# Patient Record
Sex: Male | Born: 1970 | Race: Black or African American | Hispanic: No | Marital: Single | Smoking: Former smoker
Health system: Southern US, Community
[De-identification: ages and names within clinical notes are randomized; demographics above are authoritative.]

## PROBLEM LIST (undated history)

## (undated) DIAGNOSIS — I10 Essential (primary) hypertension: Secondary | ICD-10-CM

---

## 2016-04-22 ENCOUNTER — Encounter (HOSPITAL_COMMUNITY): Payer: Self-pay

## 2016-04-22 ENCOUNTER — Emergency Department (HOSPITAL_COMMUNITY): Payer: Self-pay

## 2016-04-22 ENCOUNTER — Emergency Department (HOSPITAL_COMMUNITY)
Admission: EM | Admit: 2016-04-22 | Discharge: 2016-04-22 | Disposition: A | Discharge status: Home / Self Care | Payer: Self-pay | Attending: Emergency Medicine | Admitting: Emergency Medicine

## 2016-04-22 DIAGNOSIS — R0602 Shortness of breath: Secondary | ICD-10-CM | POA: Insufficient documentation

## 2016-04-22 DIAGNOSIS — R51 Headache: Secondary | ICD-10-CM

## 2016-04-22 DIAGNOSIS — I159 Secondary hypertension, unspecified: Secondary | ICD-10-CM | POA: Insufficient documentation

## 2016-04-22 DIAGNOSIS — R519 Headache, unspecified: Secondary | ICD-10-CM

## 2016-04-22 DIAGNOSIS — Z79899 Other long term (current) drug therapy: Secondary | ICD-10-CM | POA: Insufficient documentation

## 2016-04-22 DIAGNOSIS — Z87891 Personal history of nicotine dependence: Secondary | ICD-10-CM | POA: Insufficient documentation

## 2016-04-22 HISTORY — DX: Essential (primary) hypertension: I10

## 2016-04-22 LAB — BASIC METABOLIC PANEL
ANION GAP: 8 (ref 5–15)
BUN: 15 mg/dL (ref 6–20)
CHLORIDE: 100 mmol/L — AB (ref 101–111)
CO2: 27 mmol/L (ref 22–32)
Calcium: 9.5 mg/dL (ref 8.9–10.3)
Creatinine, Ser: 1.13 mg/dL (ref 0.61–1.24)
GFR calc Af Amer: >60 mL/min (ref >60)
GLUCOSE: 84 mg/dL (ref 65–99)
POTASSIUM: 4.6 mmol/L (ref 3.5–5.1)
Sodium: 135 mmol/L (ref 135–145)

## 2016-04-22 LAB — CBC WITH DIFFERENTIAL/PLATELET
BASOS ABS: 0 10*3/uL (ref 0.0–0.1)
BASOS PCT: 1 %
EOS ABS: 0.5 10*3/uL (ref 0.0–0.7)
Eosinophils Relative: 8 %
HCT: 44.4 % (ref 39.0–52.0)
HEMOGLOBIN: 15.3 g/dL (ref 13.0–17.0)
Lymphocytes Relative: 40 %
Lymphs Abs: 2.4 10*3/uL (ref 0.7–4.0)
MCH: 31.7 pg (ref 26.0–34.0)
MCHC: 34.5 g/dL (ref 30.0–36.0)
MCV: 92.1 fL (ref 78.0–100.0)
MONOS PCT: 9 %
Monocytes Absolute: 0.6 10*3/uL (ref 0.1–1.0)
NEUTROS ABS: 2.6 10*3/uL (ref 1.7–7.7)
NEUTROS PCT: 42 %
Platelets: 208 10*3/uL (ref 150–400)
RBC: 4.82 MIL/uL (ref 4.22–5.81)
RDW: 12 % (ref 11.5–15.5)
WBC: 6 10*3/uL (ref 4.0–10.5)

## 2016-04-22 LAB — URINALYSIS, ROUTINE W REFLEX MICROSCOPIC
Bilirubin Urine: NEGATIVE
GLUCOSE, UA: NEGATIVE mg/dL
Hgb urine dipstick: NEGATIVE
KETONES UR: NEGATIVE mg/dL
LEUKOCYTES UA: NEGATIVE
Nitrite: NEGATIVE
PROTEIN: NEGATIVE mg/dL
Specific Gravity, Urine: 1.008 (ref 1.005–1.030)
pH: 5.5 (ref 5.0–8.0)

## 2016-04-22 LAB — I-STAT TROPONIN, ED: TROPONIN I, POC: 0.01 ng/mL (ref 0.00–0.08)

## 2016-04-22 MED ORDER — HYDROCHLOROTHIAZIDE 25 MG PO TABS: 25.0000 mg | ORAL_TABLET | Freq: Every day | ORAL | 0 refills | Status: AC

## 2016-04-22 MED ORDER — HYDROCHLOROTHIAZIDE 25 MG PO TABS: 25.0000 mg | ORAL_TABLET | Freq: Every day | ORAL | 0 refills | Status: DC

## 2016-04-22 MED ORDER — IBUPROFEN 800 MG PO TABS
800.0000 mg | ORAL_TABLET | Freq: Once | ORAL | Status: AC
  Administered 2016-04-22: 800 mg via ORAL
  Filled 2016-04-22: qty 1

## 2016-04-22 MED ORDER — HYDROCHLOROTHIAZIDE 12.5 MG PO CAPS
25.0000 mg | ORAL_CAPSULE | Freq: Once | ORAL | Status: AC
  Administered 2016-04-22: 25 mg via ORAL
  Filled 2016-04-22: qty 2

## 2016-04-22 NOTE — ED Provider Notes (Signed)
[facility]-EMERGENCY DEPT Provider Note   CSN: [id number] Arrival date & time: 04/22/16  1125     History   Chief Complaint Chief Complaint  Patient presents with  . Hypertension  . Headache    HPI Juan Randall is a 45 y.o. male.  The history is provided by the patient.  Hypertension  This is a new problem. The problem occurs constantly. Associated symptoms include headaches and shortness of breath (intermittent). Pertinent negatives include no chest pain and no abdominal pain. Nothing aggravates the symptoms. Nothing relieves the symptoms. He has tried acetaminophen for the symptoms. The treatment provided moderate relief.  Headache   This is a new problem. The current episode started more than 1 week ago. The problem occurs constantly. The problem has been gradually worsening. The headache is associated with nothing. The pain is located in the bilateral and frontal region. The pain is moderate. The pain does not radiate. Associated symptoms include shortness of breath (intermittent). Pertinent negatives include no fever, no chest pressure, no palpitations, no nausea and no vomiting.    Past Medical History:  Diagnosis Date  . Hypertension     There are no active problems to display for this patient.   History reviewed. No pertinent surgical history.     Home Medications    Prior to Admission medications   Medication Sig Start Date End Date Taking? Authorizing Provider  acetaminophen (TYLENOL) 325 MG tablet Take 650 mg by mouth every 6 (six) hours as needed for headache.   Yes Historical Provider, MD  atorvastatin (LIPITOR) 40 MG tablet Take 40 mg by mouth every morning.   Yes Historical Provider, MD  GARLIC PO Take 3 capsules by mouth daily.   Yes Historical Provider, MD  Multiple Vitamin (MULTIVITAMIN WITH MINERALS) TABS tablet Take 1 tablet by mouth daily.   Yes Historical Provider, MD  Omega-3 Fatty Acids (FISH OIL) 1000 MG CAPS Take 3,000 mg by mouth daily.   Yes Historical  Provider, MD  hydrochlorothiazide (HYDRODIURIL) 25 MG tablet Take 1 tablet (25 mg total) by mouth daily. 04/22/16 05/22/16  Nira Conn, MD    Family History History reviewed. No pertinent family history.  Social History Social History  Substance Use Topics  . Smoking status: Former Games developer  . Smokeless tobacco: Never Used  . Alcohol use Yes     Allergies   Review of patient's allergies indicates no known allergies.   Review of Systems Review of Systems  Constitutional: Negative for chills and fever.  HENT: Negative for ear pain and sore throat.   Eyes: Negative for pain and visual disturbance.  Respiratory: Positive for shortness of breath (intermittent). Negative for cough.   Cardiovascular: Negative for chest pain and palpitations.  Gastrointestinal: Negative for abdominal pain, nausea and vomiting.  Genitourinary: Negative for dysuria and hematuria.  Musculoskeletal: Negative for arthralgias and back pain.  Skin: Negative for color change and rash.  Neurological: Positive for headaches. Negative for seizures and syncope.  All other systems reviewed and are negative.    Physical Exam Updated Vital Signs  Vitals:   04/22/16 1429 04/22/16 1435 04/22/16 1440 04/22/16 1601  BP:  (!) 158/108  149/98  Pulse:    71  Resp:   18 18  Temp:      TempSrc:      SpO2: 100%   100%  Weight:      Height:        Physical Exam  Constitutional: He is oriented to person, place, and  time. He appears well-developed and well-nourished. No distress.  HENT:  Head: Normocephalic and atraumatic.  Nose: Nose normal.  Eyes: Conjunctivae and EOM are normal. Pupils are equal, round, and reactive to light. Right eye exhibits no discharge. Left eye exhibits no discharge. No scleral icterus.  Neck: Normal range of motion. Neck supple.  Cardiovascular: Normal rate and regular rhythm.  Exam reveals no gallop and no friction rub.   No murmur heard. Pulmonary/Chest: Effort normal and  breath sounds normal. No stridor. No respiratory distress. He has no rales.  Abdominal: Soft. He exhibits no distension. There is no tenderness.  Musculoskeletal: He exhibits no edema or tenderness.  Neurological: He is alert and oriented to person, place, and time.  Mental Status: Alert and oriented to person, place, and time. Attention and concentration normal. Speech clear. Recent memory is intac  Cranial Nerves  II Visual Fields: Intact to confrontation. Visual fields intact. III, IV, VI: Pupils equal and reactive to light and near. Full eye movement without nystagmus  V Facial Sensation: Normal. No weakness of masticatory muscles  VII: No facial weakness or asymmetry  VIII Auditory Acuity: Grossly normal  IX/X: The uvula is midline; the palate elevates symmetrically  XI: Normal sternocleidomastoid and trapezius strength  XII: The tongue is midline. No atrophy or fasciculations.   Motor System: Muscle Strength: 5/5 and symmetric in the upper and lower extremities. No pronation or drift.  Muscle Tone: Tone and muscle bulk are normal in the upper and lower extremities.   Reflexes: DTRs: 2+ and symmetrical in all four extremities. Plantar responses are flexor bilaterally.  Coordination: Intact finger-to-nose, heel-to-shin, and rapid alternating movements. No tremor.  Sensation: Intact to light touch, and pinprick. Negative Romberg test.  Gait: Routine gait normal    Skin: Skin is warm and dry. No rash noted. He is not diaphoretic. No erythema.  Psychiatric: He has a normal mood and affect.  Vitals reviewed.    ED Treatments / Results  Labs (all labs ordered are listed, but only abnormal results are displayed) Labs Reviewed  BASIC METABOLIC PANEL - Abnormal; Notable for the following:       Result Value   Chloride 100 (*)    All other components within normal limits  CBC WITH DIFFERENTIAL/PLATELET  URINALYSIS, ROUTINE W REFLEX MICROSCOPIC (NOT AT [facility])  Rosezena Sensor, ED     EKG  EKG Interpretation  Date/Time:  Friday April 22 2016 14:24:57 EDT Ventricular Rate:  74 PR Interval:    QRS Duration: 84 QT Interval:  381 QTC Calculation: 423 R Axis:   77 Text Interpretation:  Sinus rhythm Nonspecific repol abnormality, inferior leads Minimal ST elevation, lateral leads Confirmed by [facility] MD, Mancel Lardizabal ([id number]) on 04/22/2016 2:39:37 PM       Radiology Dg Chest 2 View  Result Date: 04/22/2016 CLINICAL DATA:  Hypertension. EXAM: CHEST  2 VIEW COMPARISON:  None. FINDINGS: The heart size and mediastinal contours are within normal limits. Both lungs are clear. The visualized skeletal structures are unremarkable. IMPRESSION: Negative two view chest x-ray Electronically Signed   By: Marin Roberts M.D.   On: 04/22/2016 14:45    Procedures Procedures (including critical care time)  Medications Ordered in ED Medications  hydrochlorothiazide (MICROZIDE) capsule 25 mg (25 mg Oral Given 04/22/16 1450)  ibuprofen (ADVIL,MOTRIN) tablet 800 mg (800 mg Oral Given 04/22/16 1450)     Initial Impression / Assessment and Plan / ED Course  I have reviewed the triage vital signs and the nursing notes.  Pertinent labs & imaging results that were available during my care of the patient were reviewed by me and considered in my medical decision making (see chart for details).  Clinical Course    Non focal neuro exam. No recent head trauma. No fever. Doubt meningitis. Doubt intracranial bleed. Doubt IIH. No indication for imaging. Will treat with motrin and HCTZ and reassess.  EKG with nonspecific findings. Not concerning for acute ischemic changes. Troponin negative. No evidence of renal insufficiency. No evidence to suggest end organ damage.  Patient's headache completely resolved. Blood pressure improved.  Patient safe for discharge with strict return precautions. Patient to establish care and follow-up with primary care doctor for continued hypertension  management.    Final Clinical Impressions(s) / ED Diagnoses   Final diagnoses:  Secondary hypertension, unspecified  Acute nonintractable headache, unspecified headache type   Disposition: Discharge  Condition: Good  I have discussed the results, Dx and Tx plan with the patient who expressed understanding and agree(s) with the plan. Discharge instructions discussed at great length. The patient was given strict return precautions who verbalized understanding of the instructions. No further questions at time of discharge.    New Prescriptions   HYDROCHLOROTHIAZIDE (HYDRODIURIL) 25 MG TABLET    Take 1 tablet (25 mg total) by mouth daily.    [facility] [address] [place] [id number] [phone] Call  For help establishing care with a care provider and follow up for high blood pressure management      Nira ConnPedro Eduardo Frank Novelo, MD 04/22/16 [phone]

## 2016-04-22 NOTE — ED Triage Notes (Signed)
Pt c/o increased BP, headaches, and feels like something is stuck in his throat x 1 week.  Pain score 8/10.  Pt has not taken anything for pain.  Hx of HTN and Atvorastatin 40mg  q day.  Denies blurred vision.  NAD noted.  Pt easily speaking full sentences and maintaining saliva.  Voice is clear.    Pt attempted to follow up w/ PCP, but sts "he died."

## 2016-04-22 NOTE — ED Notes (Signed)
Unable to start line at this time due to pt being transported to XR. Will attempt to insert IV and draw labs when patient returns.

## 2016-04-22 NOTE — ED Notes (Signed)
EDP at bedside, assessment in process. Will attempt IV insertion when provider has finished exam.

## 2017-10-17 IMAGING — CR DG CHEST 2V
2 series · 2 of 2 positions shown · non-contrast
Comparison: None.

CLINICAL DATA: Hypertension.

EXAM:
CHEST  2 VIEW

[w chest pa]
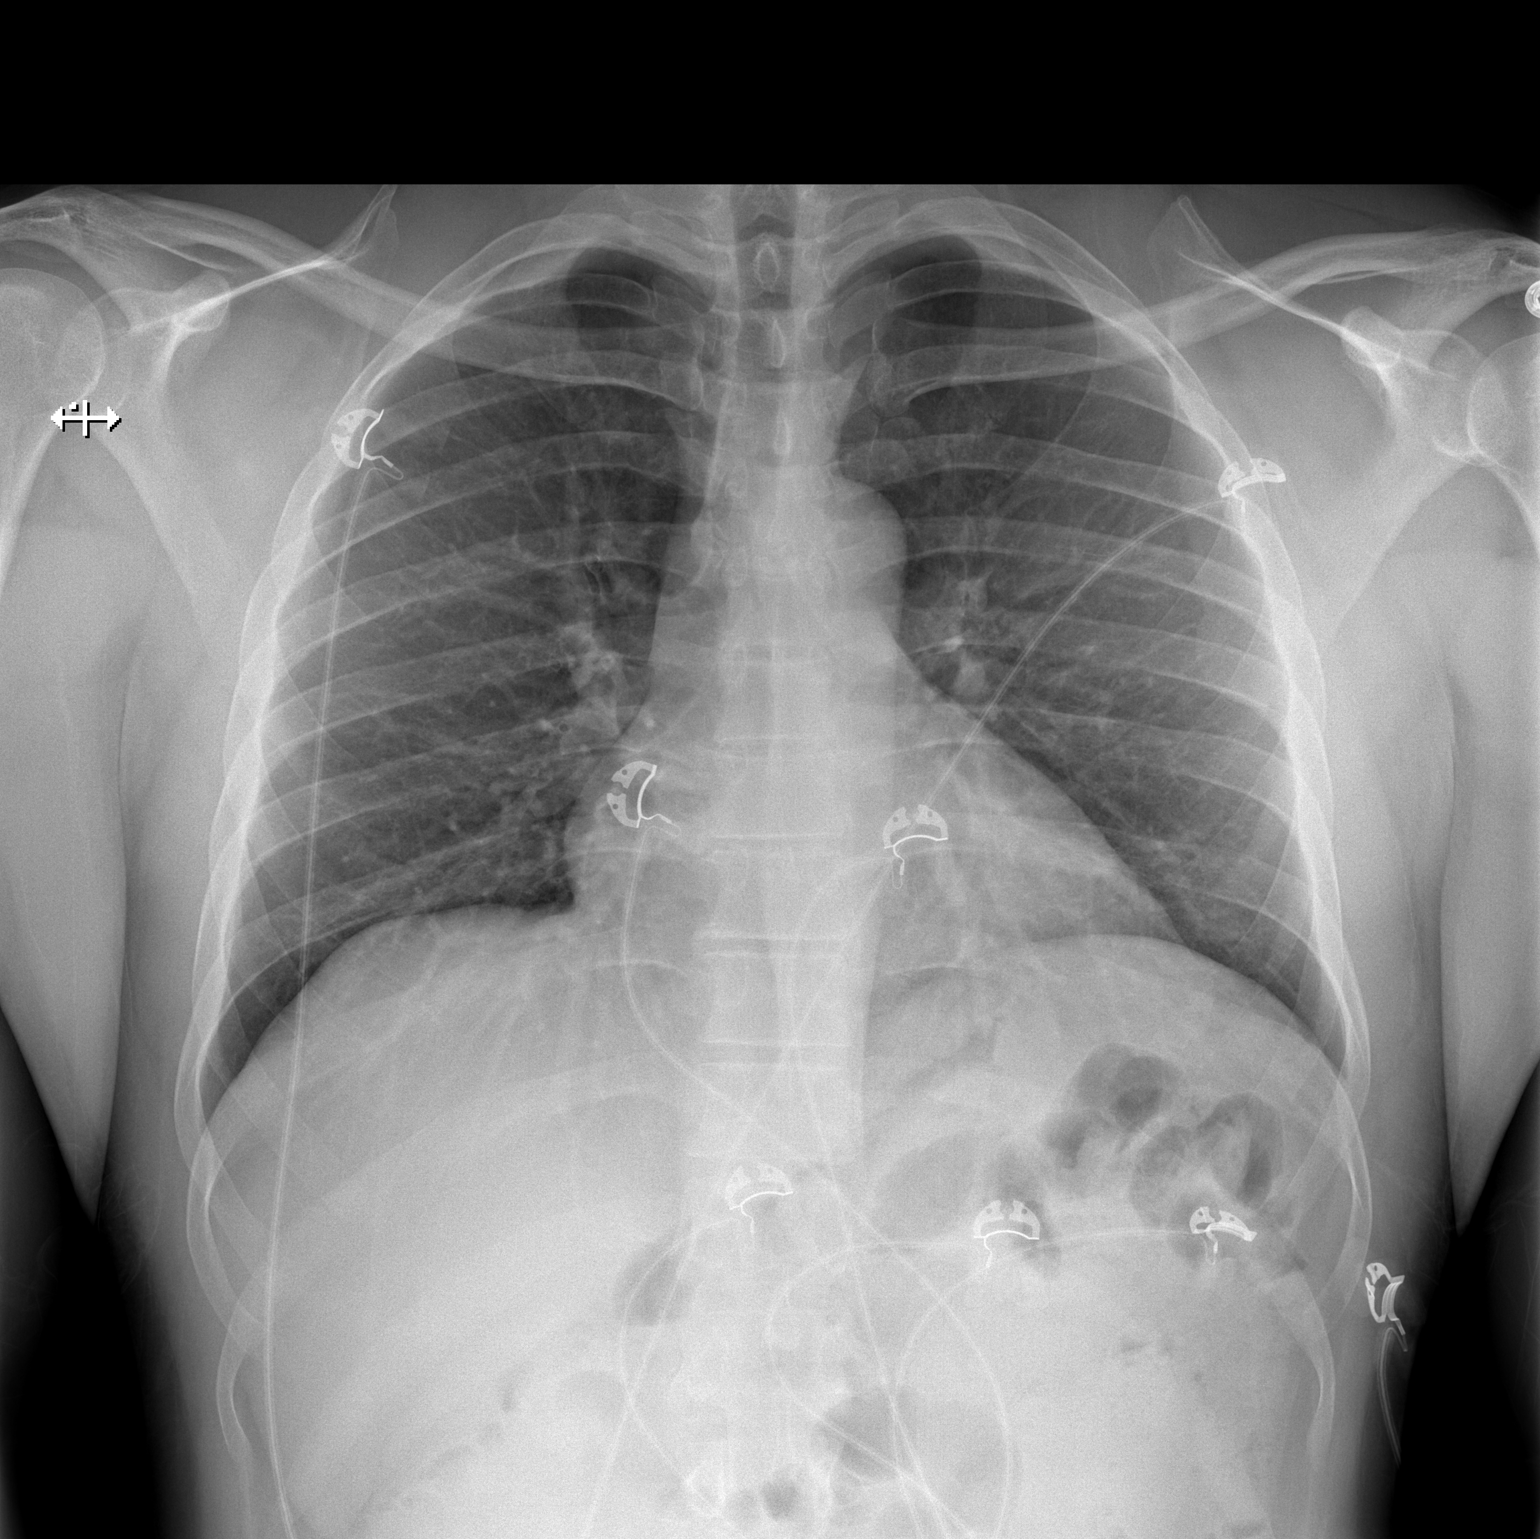

[w chest lat]
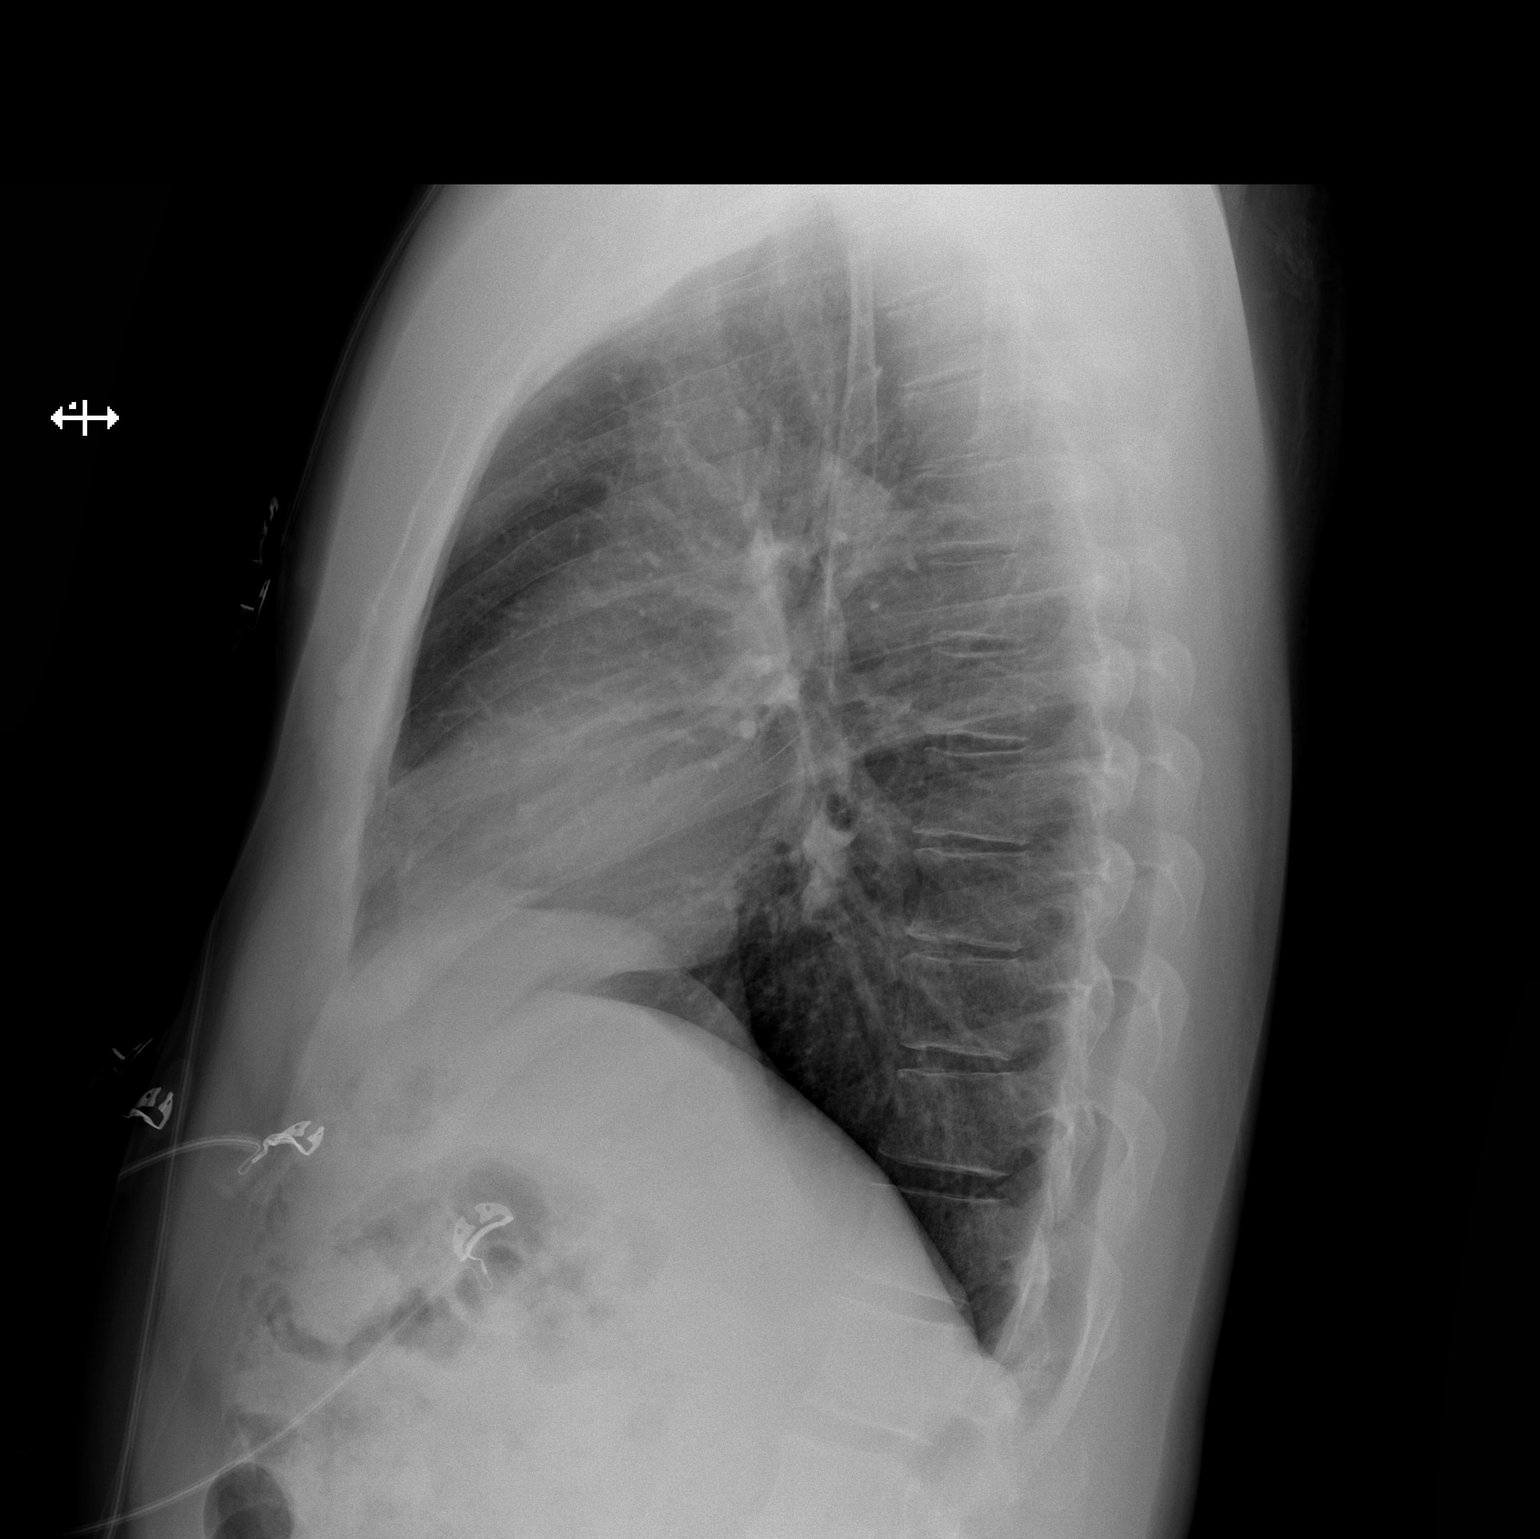

[2 of 2 positions shown; findings below may reference images not displayed]

FINDINGS: The heart size and mediastinal contours are within normal limits.
Both lungs are clear. The visualized skeletal structures are
unremarkable.
IMPRESSION: Negative two view chest x-ray
